# Patient Record
Sex: Female | Born: 1935 | Race: White | Hispanic: No | Smoking: Former smoker
Health system: Southern US, Community
[De-identification: ages and names within clinical notes are randomized; demographics above are authoritative.]

## PROBLEM LIST (undated history)

## (undated) DIAGNOSIS — K219 Gastro-esophageal reflux disease without esophagitis: Secondary | ICD-10-CM

## (undated) DIAGNOSIS — I10 Essential (primary) hypertension: Secondary | ICD-10-CM

## (undated) DIAGNOSIS — J449 Chronic obstructive pulmonary disease, unspecified: Secondary | ICD-10-CM

## (undated) DIAGNOSIS — H409 Unspecified glaucoma: Secondary | ICD-10-CM

## (undated) DIAGNOSIS — E785 Hyperlipidemia, unspecified: Secondary | ICD-10-CM

## (undated) DIAGNOSIS — J301 Allergic rhinitis due to pollen: Secondary | ICD-10-CM

## (undated) DIAGNOSIS — K5792 Diverticulitis of intestine, part unspecified, without perforation or abscess without bleeding: Secondary | ICD-10-CM

## (undated) DIAGNOSIS — M159 Polyosteoarthritis, unspecified: Secondary | ICD-10-CM

## (undated) DIAGNOSIS — I5181 Takotsubo syndrome: Secondary | ICD-10-CM

## (undated) HISTORY — DX: Essential (primary) hypertension: I10

## (undated) HISTORY — DX: Allergic rhinitis due to pollen: J30.1

## (undated) HISTORY — DX: Diverticulitis of intestine, part unspecified, without perforation or abscess without bleeding: K57.92

## (undated) HISTORY — PX: CATARACT EXTRACTION W/ INTRAOCULAR LENS IMPLANT: SHX1309

## (undated) HISTORY — DX: Takotsubo syndrome: I51.81

## (undated) HISTORY — DX: Chronic obstructive pulmonary disease, unspecified: J44.9

## (undated) HISTORY — DX: Unspecified glaucoma: H40.9

## (undated) HISTORY — DX: Hyperlipidemia, unspecified: E78.5

## (undated) HISTORY — DX: Gastro-esophageal reflux disease without esophagitis: K21.9

## (undated) HISTORY — DX: Polyosteoarthritis, unspecified: M15.9

## (undated) HISTORY — PX: TONSILLECTOMY AND ADENOIDECTOMY: SUR1326

---

## 2001-11-23 HISTORY — PX: CHOLECYSTECTOMY: SHX55

## 2007-11-24 DIAGNOSIS — I5181 Takotsubo syndrome: Secondary | ICD-10-CM

## 2007-11-24 HISTORY — DX: Takotsubo syndrome: I51.81

## 2008-11-23 HISTORY — PX: ABDOMINAL HYSTERECTOMY: SHX81

## 2011-06-24 HISTORY — PX: OTHER SURGICAL HISTORY: SHX169

## 2014-10-24 ENCOUNTER — Encounter: Payer: Self-pay | Admitting: Internal Medicine

## 2014-10-24 ENCOUNTER — Ambulatory Visit (INDEPENDENT_AMBULATORY_CARE_PROVIDER_SITE_OTHER): Payer: Medicare Other | Admitting: Internal Medicine

## 2014-10-24 ENCOUNTER — Encounter (INDEPENDENT_AMBULATORY_CARE_PROVIDER_SITE_OTHER): Payer: Self-pay

## 2014-10-24 VITALS — BP 128/70 | HR 110 | Temp 97.7°F | Ht 62.75 in | Wt 126.0 lb

## 2014-10-24 DIAGNOSIS — M150 Primary generalized (osteo)arthritis: Secondary | ICD-10-CM

## 2014-10-24 DIAGNOSIS — J449 Chronic obstructive pulmonary disease, unspecified: Secondary | ICD-10-CM | POA: Insufficient documentation

## 2014-10-24 DIAGNOSIS — I5181 Takotsubo syndrome: Secondary | ICD-10-CM

## 2014-10-24 DIAGNOSIS — K219 Gastro-esophageal reflux disease without esophagitis: Secondary | ICD-10-CM | POA: Insufficient documentation

## 2014-10-24 DIAGNOSIS — M159 Polyosteoarthritis, unspecified: Secondary | ICD-10-CM | POA: Insufficient documentation

## 2014-10-24 DIAGNOSIS — E785 Hyperlipidemia, unspecified: Secondary | ICD-10-CM | POA: Insufficient documentation

## 2014-10-24 DIAGNOSIS — Z23 Encounter for immunization: Secondary | ICD-10-CM

## 2014-10-24 DIAGNOSIS — I10 Essential (primary) hypertension: Secondary | ICD-10-CM

## 2014-10-24 DIAGNOSIS — J301 Allergic rhinitis due to pollen: Secondary | ICD-10-CM | POA: Insufficient documentation

## 2014-10-24 MED ORDER — ALBUTEROL SULFATE (2.5 MG/3ML) 0.083% IN NEBU: 2.5000 mg | INHALATION_SOLUTION | Freq: Four times a day (QID) | RESPIRATORY_TRACT | Status: DC | PRN

## 2014-10-24 NOTE — Assessment & Plan Note (Signed)
Continue secondary prevention

## 2014-10-24 NOTE — Progress Notes (Signed)
Pre visit review using our clinic review tool, if applicable. No additional management support is needed unless otherwise documented below in the visit note. 

## 2014-10-24 NOTE — Progress Notes (Signed)
Subjective:    Patient ID: Jade Villarreal, female    DOB: 09-03-1927, 80 y.o.   MRN: [id number]  HPI  Here to establish for care Here with daughter Johann Capers Just moved here from [place] by herself--living with daughter  Chronic bronchitis-- diagnosed 2009 Did stop smoking in 2005 Recently changed to anoro to take the place of advair and spiriva about a week ago (just with sample) Discussed that this doesn't have inhaled steroid Has intermittent cough--mostly dry May be more emphysema  Also with seasonal allergies  Did get some help with loratadine  Had Takotsubo cardiomyopathy Did have recovery of function Still on ARB, furosemide, statin Was on ASA but stopped--discussed going back on  Heartburn in the past No recent problems after a nexium course Now has nexium for prn use but hasn't needed  No current outpatient prescriptions on file prior to visit.   No current facility-administered medications on file prior to visit.    Allergies  Allergen Reactions  . Lisinopril Swelling    Past Medical History  Diagnosis Date  . Takotsubo cardiomyopathy 2009  . COPD (chronic obstructive pulmonary disease)   . GERD (gastroesophageal reflux disease)   . Diverticulitis   . Allergic rhinitis due to pollen   . Hyperlipidemia   . Hypertension   . Glaucoma     Past Surgical History  Procedure Laterality Date  . Abdominal hysterectomy  2010  . Cataract extraction w/ intraocular lens implant Bilateral   . Tonsillectomy and adenoidectomy    . Cholecystectomy  2003    Family History  Problem Relation Age of Onset  . Stroke Mother   . Heart disease Father   . Stroke Father   . Cancer Brother   . Stroke Brother     History   Social History  . Marital Status: Widowed    Spouse Name: N/A    Number of Children: 4  . Years of Education: N/A   Occupational History  . Oncologist      Retired   Social History Main Topics  . Smoking status:  Former Smoker    Types: Cigarettes    Quit date: 11/24/2003  . Smokeless tobacco: Never Used  . Alcohol Use: No  . Drug Use: No  . Sexual Activity: No   Other Topics Concern  . Not on file   Social History Narrative   3 living children. 1 in [place], [place]-- daughter here      No living will    Requests daughter [name] as health care POA   Not sure about DNR   No tube feeds if cognitively unaware   Review of Systems  Constitutional: Positive for fatigue and unexpected weight change.       Lost 25# in past year--large reason for move here Limited due to resp status--no longer can do housework, shopping, etc Independent with ADLs though   HENT: Positive for hearing loss. Negative for trouble swallowing.        Full dentures  Eyes: Negative for visual disturbance.  Cardiovascular: Positive for palpitations. Negative for chest pain and leg swelling.       Occ racing heart   Gastrointestinal: Negative for nausea, vomiting, abdominal pain and blood in stool.  Genitourinary: Negative for dysuria, hematuria and difficulty urinating.       Some urge incontinence--wears pad or brief  Musculoskeletal: Positive for back pain, joint swelling and arthralgias.       Fairly generalized Knee will swell  at times  Skin: Negative for rash.       Dark spot on side of right eye--plans to establish with derm  Allergic/Immunologic: Positive for environmental allergies. Negative for immunocompromised state.  Neurological: Positive for headaches. Negative for dizziness, syncope and light-headedness.  Hematological: Negative for adenopathy. Bruises/bleeds easily.  Psychiatric/Behavioral: Positive for sleep disturbance. Negative for dysphoric mood. The patient is nervous/anxious.        Not a great sleeper--- melatonin helps "worry wart"---not a big issue       Objective:   Physical Exam  Constitutional: She appears well-developed. No distress.  HENT:  Mouth is dry with some coating on  tongue. No clear thrush  Neck: Normal range of motion. Neck supple. No thyromegaly present.  Cardiovascular: Normal rate, regular rhythm and normal heart sounds.  Exam reveals no gallop.   No murmur heard. Faint pedal pulses  Pulmonary/Chest: No respiratory distress. She has no wheezes. She has no rales.  Pursed lip breathing Decreased breath sounds but clear  Abdominal: Soft. There is no tenderness.  Musculoskeletal: She exhibits no edema or tenderness.  Lymphadenopathy:    She has no cervical adenopathy.  Skin: No rash noted. No erythema.  Benign lesion right cheek  Psychiatric: She has a normal mood and affect. Her behavior is normal.          Assessment & Plan:

## 2014-10-24 NOTE — Assessment & Plan Note (Signed)
BP Readings from Last 3 Encounters:  10/24/14 128/70   Good control Recent blood work--will review when it comes

## 2014-10-24 NOTE — Assessment & Plan Note (Signed)
Does okay with tylenol

## 2014-10-24 NOTE — Assessment & Plan Note (Signed)
Recovered LV function Continue ARB, asa, statin as secondary prevention

## 2014-10-24 NOTE — Addendum Note (Signed)
Addended by: Despina Hidden on: 10/24/2014 03:27 PM   Modules accepted: Orders

## 2014-10-24 NOTE — Assessment & Plan Note (Signed)
Her main issue Has [employer]--will need lighter oxygen portable Will need handicapped tag Stopped all the anticholinergics---use albuterol nebs prn Hold off on the anoro---stay on advair and spiriva for now Consider pulmonary referral--will probably do at next visit  Records requested--will review when they get here

## 2014-10-31 ENCOUNTER — Telehealth: Payer: Self-pay | Admitting: Internal Medicine

## 2014-10-31 NOTE — Telephone Encounter (Signed)
Patient's daughter called and said patient's energy level has gone down every day since she was doing the nebulizer 4 times a day and now she's not doing the nebulizer at all.  Her breathing hasn't worsened, but she has no energy,dizzy, and her hand shakes.  Patient's daughter thinks she needs lab work done.  Dr.Letvak doesn't have any openings for this week and patient's daughter wants to know if she can be seen this week or her mother have lab work done today.

## 2014-10-31 NOTE — Telephone Encounter (Signed)
Spoke with daughter and advised results. She will call if anything changes Also she would like a call back after Dr. Silvio Pate review the records.

## 2014-10-31 NOTE — Telephone Encounter (Signed)
She can add back the ipratropium nebulizers to see if that is why she doesn't feel as good Not sure she needs labs---just got her records today and hope to review them in the next day or so Have her go back to the former nebulizer Rx and see if that helps

## 2014-11-01 ENCOUNTER — Encounter: Payer: Self-pay | Admitting: Internal Medicine

## 2014-11-01 NOTE — Telephone Encounter (Signed)
I reviewed the records and didn't see any recent labs (last I saw was March) No mention of the ipratropium nebs (their notes say combivent inhaler) or the change to [facility]  If she is better back on the regular nebs, no further action. If ongoing problems, should come back in for reeval and I can do labs then

## 2014-11-02 NOTE — Telephone Encounter (Addendum)
Spoke with daughter and advised results. She wanted pt to have labs and "work-up" sooner than march, so I scheduled appt in january

## 2014-11-03 NOTE — Telephone Encounter (Signed)
okay

## 2014-11-13 ENCOUNTER — Telehealth: Payer: Self-pay | Admitting: Internal Medicine

## 2014-11-13 NOTE — Telephone Encounter (Signed)
Form on your desk  

## 2014-11-13 NOTE — Telephone Encounter (Signed)
Pt dropped off disability parking placard form On dee's desk

## 2014-11-14 NOTE — Telephone Encounter (Signed)
I notified Jade Villarreal form is ready to be picked up.

## 2014-11-14 NOTE — Telephone Encounter (Signed)
Form done No charge 

## 2014-11-20 ENCOUNTER — Telehealth: Payer: Self-pay | Admitting: Internal Medicine

## 2014-11-20 NOTE — Telephone Encounter (Signed)
Not that I know of Please see what she needs

## 2014-11-20 NOTE — Telephone Encounter (Signed)
Was pt supposed to get a oxygen tank?

## 2014-11-20 NOTE — Telephone Encounter (Signed)
Spoke with daughter and she states her mother is weak and needs oxygen almost 24 hrs per day, she is trying to take her off of oxygen some times during the day. Daughter is requesting a small portable "purse size" tank for her mother when she takes her out. Per daughter Ace Gins needs a letter from Dr. Silvio Pate stating why she needs a portable tank. Please advise

## 2014-11-20 NOTE — Telephone Encounter (Signed)
Pt's daughter called and says she needs to give you info in ref to an oxygen tank for pt. She says it was supposed to take 3-4 weeks, but nothing has been done.  She requests a c/b ASAP. Thank you.

## 2014-11-21 NOTE — Telephone Encounter (Signed)
Discussed with [facility] and she will handle b/c this is DME.

## 2014-11-21 NOTE — Telephone Encounter (Signed)
Spoke with rep at [facility], [place] and they need a written order for a portable oxygen concentrator, office notes and documentation that she qualifies for oxygen. I explained that the pt came to [place] with oxygen and they will take notes from her previous pulmonary dr.  I did find notes from her previous physician, they are on your desk along with the [place], ok to send? With the written order.

## 2014-11-21 NOTE — Telephone Encounter (Signed)
Please check with [facility] Usually they just need the form to state that she needs portable oxygen. Please prepare whatever they need so she can get the light portable unit

## 2014-11-26 NOTE — Telephone Encounter (Signed)
Order written We cannot resend records from another doctor--they may need to request specific information directly from her past pulmonologist

## 2014-11-27 NOTE — Telephone Encounter (Signed)
Spoke with [facility] and faxed order over

## 2014-11-29 ENCOUNTER — Encounter: Payer: Self-pay | Admitting: Internal Medicine

## 2014-11-29 ENCOUNTER — Ambulatory Visit (INDEPENDENT_AMBULATORY_CARE_PROVIDER_SITE_OTHER): Payer: Medicare Other | Admitting: Internal Medicine

## 2014-11-29 VITALS — BP 148/80 | HR 122 | Temp 97.7°F | Wt 124.0 lb

## 2014-11-29 DIAGNOSIS — K219 Gastro-esophageal reflux disease without esophagitis: Secondary | ICD-10-CM

## 2014-11-29 DIAGNOSIS — E785 Hyperlipidemia, unspecified: Secondary | ICD-10-CM

## 2014-11-29 DIAGNOSIS — I5181 Takotsubo syndrome: Secondary | ICD-10-CM

## 2014-11-29 DIAGNOSIS — J449 Chronic obstructive pulmonary disease, unspecified: Secondary | ICD-10-CM

## 2014-11-29 DIAGNOSIS — I10 Essential (primary) hypertension: Secondary | ICD-10-CM

## 2014-11-29 LAB — CBC WITH DIFFERENTIAL/PLATELET
BASOS ABS: 0.1 10*3/uL (ref 0.0–0.1)
Basophils Relative: 0.5 % (ref 0.0–3.0)
EOS PCT: 1.5 % (ref 0.0–5.0)
Eosinophils Absolute: 0.2 10*3/uL (ref 0.0–0.7)
HEMATOCRIT: 31.3 % — AB (ref 36.0–46.0)
Hemoglobin: 10 g/dL — ABNORMAL LOW (ref 12.0–15.0)
LYMPHS ABS: 1.5 10*3/uL (ref 0.7–4.0)
Lymphocytes Relative: 12.8 % (ref 12.0–46.0)
MCHC: 32 g/dL (ref 30.0–36.0)
MCV: 81.7 fl (ref 78.0–100.0)
MONOS PCT: 9.3 % (ref 3.0–12.0)
Monocytes Absolute: 1.1 10*3/uL — ABNORMAL HIGH (ref 0.1–1.0)
NEUTROS ABS: 8.6 10*3/uL — AB (ref 1.4–7.7)
NEUTROS PCT: 75.9 % (ref 43.0–77.0)
PLATELETS: 511 10*3/uL — AB (ref 150.0–400.0)
RBC: 3.83 Mil/uL — ABNORMAL LOW (ref 3.87–5.11)
RDW: 15.2 % (ref 11.5–15.5)
WBC: 11.4 10*3/uL — AB (ref 4.0–10.5)

## 2014-11-29 LAB — COMPREHENSIVE METABOLIC PANEL
ALT: 13 U/L (ref 0–35)
AST: 15 U/L (ref 0–37)
Albumin: 3 g/dL — ABNORMAL LOW (ref 3.5–5.2)
Alkaline Phosphatase: 95 U/L (ref 39–117)
BUN: 11 mg/dL (ref 6–23)
CO2: 31 meq/L (ref 19–32)
Calcium: 10.4 mg/dL (ref 8.4–10.5)
Chloride: 100 mEq/L (ref 96–112)
Creatinine, Ser: 0.6 mg/dL (ref 0.4–1.2)
GFR: 97.21 mL/min (ref >60.00)
Glucose, Bld: 101 mg/dL — ABNORMAL HIGH (ref 70–99)
Potassium: 3.9 mEq/L (ref 3.5–5.1)
Sodium: 139 mEq/L (ref 135–145)
Total Bilirubin: 0.3 mg/dL (ref 0.2–1.2)
Total Protein: 6.9 g/dL (ref 6.0–8.3)

## 2014-11-29 LAB — LIPID PANEL
CHOL/HDL RATIO: 3
Cholesterol: 137 mg/dL (ref 0–200)
HDL: 39.4 mg/dL (ref >39.00)
LDL Cholesterol: 76 mg/dL (ref 0–99)
NONHDL: 97.6
TRIGLYCERIDES: 110 mg/dL (ref 0.0–149.0)
VLDL: 22 mg/dL (ref 0.0–40.0)

## 2014-11-29 LAB — T4, FREE: FREE T4: 0.9 ng/dL (ref 0.60–1.60)

## 2014-11-29 MED ORDER — PRAVASTATIN SODIUM 20 MG PO TABS: 20.0000 mg | ORAL_TABLET | Freq: Every day | ORAL | Status: DC

## 2014-11-29 MED ORDER — FUROSEMIDE 20 MG PO TABS: 20.0000 mg | ORAL_TABLET | Freq: Every day | ORAL | Status: DC

## 2014-11-29 NOTE — Assessment & Plan Note (Signed)
Doing okay on med Shouldn't decrease due to risk of exacerbating pulm disease

## 2014-11-29 NOTE — Assessment & Plan Note (Signed)
BP Readings from Last 3 Encounters:  11/29/14 148/80  10/24/14 128/70   Acceptable control BP may be higher due to exertion of walking in since she is tachycardic

## 2014-11-29 NOTE — Assessment & Plan Note (Signed)
Severe and very limited ability to walk around Some trouble with my adjustments (cutting out the duoneb and changing to albuterol)---she is back using duoneb up to bid Oxygen just about all the time Awaiting lighter portable oxygen

## 2014-11-29 NOTE — Assessment & Plan Note (Signed)
Due for labs No problems with statin

## 2014-11-29 NOTE — Progress Notes (Signed)
Pre visit review using our clinic review tool, if applicable. No additional management support is needed unless otherwise documented below in the visit note. 

## 2014-11-29 NOTE — Assessment & Plan Note (Signed)
No active symptoms Discussed need to stay as active as possible to prevent aerobic loss of function

## 2014-11-29 NOTE — Progress Notes (Signed)
Subjective:    Patient ID: Jade Villarreal, female    DOB: 1926/11/27, 81 y.o.   MRN: [id number]  HPI Here with daughter for follow up of COPD and other medical conditions  Has been acclimating to [place] waiting from [place] about the lighter oxygen equipment Still taking advair and spiriva as we discussed the last time  Had bad time after last week--- shaking and sleepy. Going to bed right after dinner Daughter close to bringing her to ER --but this did improve Daughter gives protein shakes inbetween meals  More cough in past week or 2 Dry cough Doesn't always cough so this is a change No fever but gets chills in afternoon  Some knee and ankle pain No swelling though Gets soreness around stomach but no chest pain  No heartburn  No swallowing problems  Not walking much Can't support the current portable tank at this point and fatigues easily  Current Outpatient Prescriptions on File Prior to Visit  Medication Sig Dispense Refill  . Acetaminophen (TYLENOL ARTHRITIS EXT RELIEF PO) Take by mouth as needed.    Marland Kitchen albuterol (PROVENTIL) (2.5 MG/3ML) 0.083% nebulizer solution Take 3 mLs (2.5 mg total) by nebulization every 6 (six) hours as needed for wheezing or shortness of breath. 150 mL 1  . aspirin EC 81 MG tablet Take 81 mg by mouth every other day.    . Biotin 1000 MCG tablet Take 1,000 mcg by mouth daily.    . calcium carbonate (OS-CAL) 600 MG TABS tablet Take 600 mg by mouth 2 (two) times daily with a meal.    . Cholecalciferol (VITAMIN D) 2000 UNITS CAPS Take by mouth daily.    . Cyanocobalamin (B-12) 5000 MCG CAPS Take by mouth as needed.    . Fluticasone-Salmeterol (ADVAIR DISKUS) 500-50 MCG/DOSE AEPB Inhale 1 puff into the lungs 2 (two) times daily.    . furosemide (LASIX) 20 MG tablet Take 20 mg by mouth daily.    Marland Kitchen ibuprofen (ADVIL,MOTRIN) 200 MG tablet Take 200 mg by mouth as needed.    Marland Kitchen losartan (COZAAR) 25 MG tablet Take 25 mg by mouth daily.      . Melatonin 5 MG CAPS Take by mouth as needed.    . Misc Natural Products (OSTEO BI-FLEX TRIPLE STRENGTH) TABS Take by mouth daily as needed.    . pravastatin (PRAVACHOL) 20 MG tablet Take 20 mg by mouth daily.    Marland Kitchen Propylene Glycol (SYSTANE BALANCE) 0.6 % SOLN Apply to eye as needed.    . tiotropium (SPIRIVA HANDIHALER) 18 MCG inhalation capsule Place 18 mcg into inhaler and inhale daily.     No current facility-administered medications on file prior to visit.    Allergies  Allergen Reactions  . Lisinopril Swelling    Past Medical History  Diagnosis Date  . Takotsubo cardiomyopathy 2009  . COPD (chronic obstructive pulmonary disease)   . GERD (gastroesophageal reflux disease)   . Diverticulitis   . Allergic rhinitis due to pollen   . Hyperlipidemia   . Hypertension   . Glaucoma   . Osteoarthritis of multiple joints     Past Surgical History  Procedure Laterality Date  . Abdominal hysterectomy  2010  . Cataract extraction w/ intraocular lens implant Bilateral   . Tonsillectomy and adenoidectomy    . Cholecystectomy  2003  . Dexa  8/12    Borderline osteopenia-- T= -1.3 at most    Family History  Problem Relation Age of Onset  . Stroke  Mother   . Heart disease Father   . Stroke Father   . Cancer Brother   . Stroke Brother     History   Social History  . Marital Status: Widowed    Spouse Name: N/A    Number of Children: 4  . Years of Education: N/A   Occupational History  . Oncologist      Retired   Social History Main Topics  . Smoking status: Former Smoker    Types: Cigarettes    Quit date: 11/24/2003  . Smokeless tobacco: Never Used  . Alcohol Use: No  . Drug Use: No  . Sexual Activity: No   Other Topics Concern  . Not on file   Social History Narrative   3 living children. 1 in [place], [place]-- daughter here      No living will    Requests daughter Jade Villarreal as health care POA   Not sure about DNR   No tube feeds if cognitively  unaware   Review of Systems Appetite is off some Weight is down 2# Some constipation--this seems better now Ongoing shaking in hands    Objective:   Physical Exam  Constitutional: No distress.  Very slow walking--daughter supports her  Cardiovascular: Regular rhythm and normal heart sounds.  Exam reveals no gallop.   No murmur heard. Fast after walking in here  Pulmonary/Chest: No respiratory distress. She has no wheezes. She has no rales.  Decreased breath sounds throughout  Not tight or wheezy though Pursed lip breathing still  Musculoskeletal: She exhibits no edema or tenderness.          Assessment & Plan:

## 2014-11-30 ENCOUNTER — Encounter: Payer: Self-pay | Admitting: *Deleted

## 2014-11-30 ENCOUNTER — Telehealth: Payer: Self-pay | Admitting: Internal Medicine

## 2014-11-30 NOTE — Telephone Encounter (Signed)
emmi mailed  °

## 2014-12-03 ENCOUNTER — Other Ambulatory Visit (INDEPENDENT_AMBULATORY_CARE_PROVIDER_SITE_OTHER): Payer: Medicare Other

## 2014-12-03 DIAGNOSIS — D508 Other iron deficiency anemias: Secondary | ICD-10-CM

## 2014-12-03 LAB — FOLATE: Folate: 16.1 ng/mL (ref >5.9)

## 2014-12-03 LAB — VITAMIN B12: VITAMIN B 12: 549 pg/mL (ref 211–911)

## 2014-12-03 LAB — IBC PANEL
Iron: 20 ug/dL — ABNORMAL LOW (ref 42–145)
SATURATION RATIOS: 9.8 % — AB (ref 20.0–50.0)
TRANSFERRIN: 146 mg/dL — AB (ref 212.0–360.0)

## 2014-12-03 LAB — FERRITIN: Ferritin: 319.8 ng/mL — ABNORMAL HIGH (ref 10.0–291.0)

## 2014-12-04 ENCOUNTER — Telehealth: Payer: Self-pay | Admitting: Internal Medicine

## 2014-12-04 ENCOUNTER — Other Ambulatory Visit: Payer: Self-pay | Admitting: Internal Medicine

## 2014-12-04 ENCOUNTER — Ambulatory Visit (INDEPENDENT_AMBULATORY_CARE_PROVIDER_SITE_OTHER)
Admission: RE | Admit: 2014-12-04 | Discharge: 2014-12-04 | Disposition: A | Discharge status: Home / Self Care | Payer: Medicare Other | Source: Ambulatory Visit | Attending: Internal Medicine | Admitting: Internal Medicine

## 2014-12-04 DIAGNOSIS — R05 Cough: Secondary | ICD-10-CM

## 2014-12-04 DIAGNOSIS — R059 Cough, unspecified: Secondary | ICD-10-CM

## 2014-12-04 MED ORDER — LEVOFLOXACIN 500 MG PO TABS: 500.0000 mg | ORAL_TABLET | Freq: Every day | ORAL | Status: DC

## 2014-12-04 NOTE — Telephone Encounter (Signed)
Spoke to patient and then daughter about x-ray Several abnormal areas---some concern for cancer Left side with effusion needs to be treated as pneumonia She did have pneumonia in October which the daughter doesn't think she totally got over  Will treat with levaquin---discussed possibility of confusion with daughter---she will let me know of any problems Will see in 2 weeks--recheck CXR and consider CT scan

## 2014-12-10 ENCOUNTER — Telehealth: Payer: Self-pay | Admitting: Internal Medicine

## 2014-12-10 NOTE — Telephone Encounter (Signed)
Form done No charge 

## 2014-12-10 NOTE — Telephone Encounter (Signed)
Pts daughter [name] dropped off disability placard form to be filled out.Will put inbox Please call when ready to be picked up.

## 2014-12-11 NOTE — Telephone Encounter (Signed)
I notified patient's daughter form is ready to be picked up.

## 2014-12-18 ENCOUNTER — Ambulatory Visit (INDEPENDENT_AMBULATORY_CARE_PROVIDER_SITE_OTHER)
Admission: RE | Admit: 2014-12-18 | Discharge: 2014-12-18 | Disposition: A | Discharge status: Home / Self Care | Payer: Medicare Other | Source: Ambulatory Visit | Attending: Internal Medicine | Admitting: Internal Medicine

## 2014-12-18 ENCOUNTER — Ambulatory Visit (INDEPENDENT_AMBULATORY_CARE_PROVIDER_SITE_OTHER): Payer: Medicare Other | Admitting: Internal Medicine

## 2014-12-18 ENCOUNTER — Encounter: Payer: Self-pay | Admitting: Internal Medicine

## 2014-12-18 VITALS — BP 120/70 | HR 104 | Temp 97.8°F

## 2014-12-18 DIAGNOSIS — J90 Pleural effusion, not elsewhere classified: Secondary | ICD-10-CM | POA: Insufficient documentation

## 2014-12-18 DIAGNOSIS — R059 Cough, unspecified: Secondary | ICD-10-CM

## 2014-12-18 DIAGNOSIS — R05 Cough: Secondary | ICD-10-CM

## 2014-12-18 DIAGNOSIS — J948 Other specified pleural conditions: Secondary | ICD-10-CM

## 2014-12-18 NOTE — Assessment & Plan Note (Signed)
And persistent atelectasis on CXR Very concerning for neoplasm Antibiotic didn't really help the sig dyspnea Will check CT scan (with contrast per radiologist recommendation)

## 2014-12-18 NOTE — Progress Notes (Signed)
Subjective:    Patient ID: Jade Villarreal, female    DOB: 03-11-1927, 81 y.o.   MRN: [id number]  HPI Here with daughter for follow up of dyspnea  Feels perhaps a little better but not much Ongoing DOE---now needs help with instrumental ADLs  Slight cough and white phlegm No fever No chills or sweats recently--but feels cold  Current Outpatient Prescriptions on File Prior to Visit  Medication Sig Dispense Refill  . Acetaminophen (TYLENOL ARTHRITIS EXT RELIEF PO) Take by mouth as needed.    Marland Kitchen albuterol (PROVENTIL) (2.5 MG/3ML) 0.083% nebulizer solution Take 3 mLs (2.5 mg total) by nebulization every 6 (six) hours as needed for wheezing or shortness of breath. 150 mL 1  . aspirin EC 81 MG tablet Take 81 mg by mouth every other day.    . Biotin 1000 MCG tablet Take 1,000 mcg by mouth daily.    . calcium carbonate (OS-CAL) 600 MG TABS tablet Take 600 mg by mouth 2 (two) times daily with a meal.    . Cholecalciferol (VITAMIN D) 2000 UNITS CAPS Take by mouth daily.    . Cyanocobalamin (B-12) 5000 MCG CAPS Take by mouth as needed.    . Fluticasone-Salmeterol (ADVAIR DISKUS) 500-50 MCG/DOSE AEPB Inhale 1 puff into the lungs 2 (two) times daily.    . furosemide (LASIX) 20 MG tablet Take 1 tablet (20 mg total) by mouth daily. 90 tablet 3  . ibuprofen (ADVIL,MOTRIN) 200 MG tablet Take 200 mg by mouth as needed.    Marland Kitchen ipratropium-albuterol (DUONEB) 0.5-2.5 (3) MG/3ML SOLN Take 3 mLs by nebulization 2 (two) times daily as needed.    Marland Kitchen losartan (COZAAR) 25 MG tablet Take 25 mg by mouth daily.    . Melatonin 5 MG CAPS Take by mouth as needed.    . Misc Natural Products (OSTEO BI-FLEX TRIPLE STRENGTH) TABS Take by mouth daily as needed.    . pravastatin (PRAVACHOL) 20 MG tablet Take 1 tablet (20 mg total) by mouth daily. 90 tablet 3  . Propylene Glycol (SYSTANE BALANCE) 0.6 % SOLN Apply to eye as needed.    . tiotropium (SPIRIVA HANDIHALER) 18 MCG inhalation capsule Place 18 mcg into inhaler and  inhale daily.     No current facility-administered medications on file prior to visit.    Allergies  Allergen Reactions  . Lisinopril Swelling    Past Medical History  Diagnosis Date  . Takotsubo cardiomyopathy 2009  . COPD (chronic obstructive pulmonary disease)   . GERD (gastroesophageal reflux disease)   . Diverticulitis   . Allergic rhinitis due to pollen   . Hyperlipidemia   . Hypertension   . Glaucoma   . Osteoarthritis of multiple joints     Past Surgical History  Procedure Laterality Date  . Abdominal hysterectomy  2010  . Cataract extraction w/ intraocular lens implant Bilateral   . Tonsillectomy and adenoidectomy    . Cholecystectomy  2003  . Dexa  8/12    Borderline osteopenia-- T= -1.3 at most    Family History  Problem Relation Age of Onset  . Stroke Mother   . Heart disease Father   . Stroke Father   . Cancer Brother   . Stroke Brother     History   Social History  . Marital Status: Widowed    Spouse Name: N/A    Number of Children: 4  . Years of Education: N/A   Occupational History  . Oncologist      Retired  Social History Main Topics  . Smoking status: Former Smoker    Types: Cigarettes    Quit date: 11/24/2003  . Smokeless tobacco: Never Used  . Alcohol Use: No  . Drug Use: No  . Sexual Activity: No   Other Topics Concern  . Not on file   Social History Narrative   3 living children. 1 in [place], [place]-- daughter here      No living will    Requests daughter [name] as health care POA   Not sure about DNR   No tube feeds if cognitively unaware   Review of Systems Appetite is poor Weight has been going down Sleeps poorly    Objective:   Physical Exam  Constitutional: She appears well-developed. No distress.  HENT:  Left canal blocked with cerumen Right is normal  Neck: No thyromegaly present.  No parotid enlargement (she had felt some swelling recently)  Pulmonary/Chest: Effort normal. No respiratory  distress. She has no wheezes. She has no rales.  Decreased breath sounds in LLL  Lymphadenopathy:    She has no cervical adenopathy.          Assessment & Plan:

## 2014-12-18 NOTE — Progress Notes (Signed)
Pre visit review using our clinic review tool, if applicable. No additional management support is needed unless otherwise documented below in the visit note. 

## 2014-12-19 ENCOUNTER — Ambulatory Visit (INDEPENDENT_AMBULATORY_CARE_PROVIDER_SITE_OTHER)
Admission: RE | Admit: 2014-12-19 | Discharge: 2014-12-19 | Disposition: A | Discharge status: Home / Self Care | Payer: Medicare Other | Source: Ambulatory Visit | Attending: Internal Medicine | Admitting: Internal Medicine

## 2014-12-19 ENCOUNTER — Telehealth: Payer: Self-pay | Admitting: Internal Medicine

## 2014-12-19 DIAGNOSIS — J90 Pleural effusion, not elsewhere classified: Secondary | ICD-10-CM

## 2014-12-19 DIAGNOSIS — J948 Other specified pleural conditions: Secondary | ICD-10-CM

## 2014-12-19 MED ORDER — IOHEXOL 300 MG/ML  SOLN
80.0000 mL | Freq: Once | INTRAMUSCULAR | Status: AC | PRN
  Administered 2014-12-19: 80 mL via INTRAVENOUS

## 2014-12-19 NOTE — Telephone Encounter (Signed)
Call from [facility] radiology with read from CT chest with contrast with obstructing LLL mass 6x6x8 cm also advanced emphysema. Several foci in the liver largest 1x1 cm. Concerning for carcinoma, follow up with PET-CT or tissue sampling recommended.

## 2014-12-20 NOTE — Telephone Encounter (Signed)
PLEASE NOTE: All timestamps contained within this report are represented as [place] Standard Time. CONFIDENTIALTY NOTICE: This fax transmission is intended only for the addressee. It contains information that is legally privileged, confidential or otherwise protected from use or disclosure. If you are not the intended recipient, you are strictly prohibited from reviewing, disclosing, copying using or disseminating any of this information or taking any action in reliance on or regarding this information. If you have received this fax in error, please notify us immediately by telephone so that we can arrange for its return to [place]. Phone: [phone], Toll-Free: [phone], Fax: [phone] Page: 1 of 1 Call Id: [id number] [place] Patient Name: HELMA ARGYLE Gender: Female DOB: Dec 12, 1926 Age: 81 Y 2 M 6 D Return Phone Number: Address: City/State/Zip: [place] Client [place] Night - Client Client Site [place] Physician Viviana Simpler Contact Type Call Call Type Page Only Caller Name [facility] Radiology Relationship To Patient Other Is this call to report lab results? No Return Phone Number Unavailable Initial Comment Caller states they have results to give on the patient. [phone] Nurse Assessment Guidelines Guideline Title Affirmed Question Affirmed Notes Nurse Date/Time (Eastern Time) Disp. Time Eilene Ghazi Time) Disposition Final User 12/19/2014 5:15:16 PM Send to [place], Zachary 12/19/2014 5:26:02 PM Called On-Call Provider May[place], Monus 12/19/2014 5:26:43 PM Page Completed Yes Doug Sou, Monus After Care Instructions Given Call Event Type User Date / Time Description Paging DoctorName DoctorPhone DateTime Result/Outcome Notes Vertell Novak [phone] 12/19/2014 5:25:59 PM Called On Call  Provider - Reached Vertell Novak 12/19/2014 5:26:34 PM Spoke with On Call - General Provided on-call with page information . Connected the oncall to the caller.

## 2014-12-20 NOTE — Telephone Encounter (Signed)
Spoke to daughter at length. Has clear cut metastatic lung cancer. Prognosis is very poor  Might be candidate for palliative RT--though that could be a problem with her severe COPD. Lesion may be easily biopsied with bronchoscopy--but not sure she is a chemo candidate (maybe if small cell which is unlikely with that location). Could consider evaluation with oncologist.  May just want to opt for hospice care   They will come in on Monday at [facility] to discuss this more

## 2014-12-24 ENCOUNTER — Encounter: Payer: Self-pay | Admitting: Internal Medicine

## 2014-12-24 ENCOUNTER — Ambulatory Visit (INDEPENDENT_AMBULATORY_CARE_PROVIDER_SITE_OTHER): Payer: Medicare Other | Admitting: Internal Medicine

## 2014-12-24 VITALS — BP 124/64 | HR 106 | Temp 98.9°F | Resp 18 | Ht 63.0 in | Wt 124.0 lb

## 2014-12-24 DIAGNOSIS — C3492 Malignant neoplasm of unspecified part of left bronchus or lung: Secondary | ICD-10-CM

## 2014-12-24 DIAGNOSIS — C3490 Malignant neoplasm of unspecified part of unspecified bronchus or lung: Secondary | ICD-10-CM | POA: Insufficient documentation

## 2014-12-24 NOTE — Progress Notes (Signed)
Subjective:    Patient ID: Jade Villarreal, female    DOB: [birth year], 81 y.o.   MRN: [id number]  HPI Here with daughter and her husband to review CT scan  Clearly has advanced cancer Obstructing central lesion with spread to liver and adrenals Feels okay right now  Current Outpatient Prescriptions on File Prior to Visit  Medication Sig Dispense Refill  . Acetaminophen (TYLENOL ARTHRITIS EXT RELIEF PO) Take by mouth as needed.    Marland Kitchen albuterol (PROVENTIL) (2.5 MG/3ML) 0.083% nebulizer solution Take 3 mLs (2.5 mg total) by nebulization every 6 (six) hours as needed for wheezing or shortness of breath. 150 mL 1  . aspirin EC 81 MG tablet Take 81 mg by mouth every other day.    . Biotin 1000 MCG tablet Take 1,000 mcg by mouth daily.    . calcium carbonate (OS-CAL) 600 MG TABS tablet Take 600 mg by mouth 2 (two) times daily with a meal.    . Cholecalciferol (VITAMIN D) 2000 UNITS CAPS Take by mouth daily.    . Cyanocobalamin (B-12) 5000 MCG CAPS Take by mouth as needed.    . Fluticasone-Salmeterol (ADVAIR DISKUS) 500-50 MCG/DOSE AEPB Inhale 1 puff into the lungs 2 (two) times daily.    . furosemide (LASIX) 20 MG tablet Take 1 tablet (20 mg total) by mouth daily. 90 tablet 3  . ibuprofen (ADVIL,MOTRIN) 200 MG tablet Take 200 mg by mouth as needed.    Marland Kitchen ipratropium-albuterol (DUONEB) 0.5-2.5 (3) MG/3ML SOLN Take 3 mLs by nebulization 2 (two) times daily as needed.    . Iron TABS Take by mouth daily.    Marland Kitchen losartan (COZAAR) 25 MG tablet Take 25 mg by mouth daily.    . Melatonin 5 MG CAPS Take by mouth as needed.    . Misc Natural Products (OSTEO BI-FLEX TRIPLE STRENGTH) TABS Take by mouth daily as needed.    . pravastatin (PRAVACHOL) 20 MG tablet Take 1 tablet (20 mg total) by mouth daily. 90 tablet 3  . Propylene Glycol (SYSTANE BALANCE) 0.6 % SOLN Apply to eye as needed.    . tiotropium (SPIRIVA HANDIHALER) 18 MCG inhalation capsule Place 18 mcg into inhaler and inhale daily.     No  current facility-administered medications on file prior to visit.    Allergies  Allergen Reactions  . Lisinopril Swelling    Past Medical History  Diagnosis Date  . Takotsubo cardiomyopathy 2009  . COPD (chronic obstructive pulmonary disease)   . GERD (gastroesophageal reflux disease)   . Diverticulitis   . Allergic rhinitis due to pollen   . Hyperlipidemia   . Hypertension   . Glaucoma   . Osteoarthritis of multiple joints     Past Surgical History  Procedure Laterality Date  . Abdominal hysterectomy  2010  . Cataract extraction w/ intraocular lens implant Bilateral   . Tonsillectomy and adenoidectomy    . Cholecystectomy  2003  . Dexa  8/12    Borderline osteopenia-- T= -1.3 at most    Family History  Problem Relation Age of Onset  . Stroke Mother   . Heart disease Father   . Stroke Father   . Cancer Brother   . Stroke Brother     History   Social History  . Marital Status: Widowed    Spouse Name: N/A    Number of Children: 4  . Years of Education: N/A   Occupational History  . Oncologist      Retired  Social History Main Topics  . Smoking status: Former Smoker    Types: Cigarettes    Quit date: 11/24/2003  . Smokeless tobacco: Never Used  . Alcohol Use: No  . Drug Use: No  . Sexual Activity: No   Other Topics Concern  . Not on file   Social History Narrative   3 living children. 1 in [place], [place]-- daughter here      No living will    Requests daughter [name] as health care POA   Not sure about DNR   No tube feeds if cognitively unaware    Review of Systems Appetite not great but daughter making her 3 meals a day and supplements Sleep is not great--- goes to bed early and sleeps for 6 hours or so. Then is up like midnight and can't go back to sleep. Has tried the melatonin    Objective:   Physical Exam  Constitutional: She appears well-developed. No distress.  Psychiatric:  Has insight and seems to understand what is going  on Slight tearful          Assessment & Plan:

## 2014-12-24 NOTE — Progress Notes (Signed)
Pre visit review using our clinic review tool, if applicable. No additional management support is needed unless otherwise documented below in the visit note. 

## 2014-12-24 NOTE — Assessment & Plan Note (Signed)
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

## 2014-12-25 ENCOUNTER — Telehealth: Payer: Self-pay | Admitting: Internal Medicine

## 2014-12-25 NOTE — Telephone Encounter (Signed)
[name] from [facility] stopped by today to try to get Jade Villarreal setup for her portable oxygen concentrator. She needs a POC RX signed and a letter with medical justification for POC Paperwork left on CMA desk. Verbiage examples for letter include "patient requires POC replacing gas tanks due to dexterity problems causing inability to change out tanks" or another example is "patient requires POC replacing gas tanks due to ambulatory issues causing stress on patients back." Any questions or to notify that paperwork is ready please call [facility] @ [phone]

## 2014-12-26 ENCOUNTER — Encounter: Payer: Self-pay | Admitting: Internal Medicine

## 2014-12-26 NOTE — Telephone Encounter (Signed)
Please send letter No charge

## 2014-12-26 NOTE — Telephone Encounter (Signed)
I left a message for Jade Villarreal to let her know letter is ready to be picked up.  I, also, let her know that patient has started Hospice.

## 2015-01-01 ENCOUNTER — Ambulatory Visit: Payer: Medicare Other | Admitting: Internal Medicine

## 2015-01-10 DIAGNOSIS — I5181 Takotsubo syndrome: Secondary | ICD-10-CM | POA: Diagnosis not present

## 2015-01-10 DIAGNOSIS — C3432 Malignant neoplasm of lower lobe, left bronchus or lung: Secondary | ICD-10-CM | POA: Diagnosis not present

## 2015-01-10 DIAGNOSIS — J449 Chronic obstructive pulmonary disease, unspecified: Secondary | ICD-10-CM | POA: Diagnosis not present

## 2015-01-10 DIAGNOSIS — J90 Pleural effusion, not elsewhere classified: Secondary | ICD-10-CM

## 2015-01-18 ENCOUNTER — Ambulatory Visit: Payer: Medicare Other | Admitting: Internal Medicine

## 2015-01-23 ENCOUNTER — Ambulatory Visit: Payer: Medicare Other | Admitting: Internal Medicine

## 2015-02-27 ENCOUNTER — Telehealth: Payer: Self-pay | Admitting: Internal Medicine

## 2015-02-27 MED ORDER — TRAMADOL HCL 50 MG PO TABS: 25.0000 mg | ORAL_TABLET | Freq: Three times a day (TID) | ORAL | Status: AC | PRN

## 2015-02-27 NOTE — Telephone Encounter (Signed)
rx called into pharmacy

## 2015-02-27 NOTE — Telephone Encounter (Signed)
Message from [facility] Having some pain Will start with some tramadol 25-50 tid prn

## 2015-03-04 ENCOUNTER — Telehealth: Payer: Self-pay | Admitting: Internal Medicine

## 2015-03-04 NOTE — Telephone Encounter (Signed)
Notified of ongoing sleep issues on Friday --by Tammy RN with hospice Tried to call today Left message at daughter's house Then spoke to [place] [facility] again Will see if ongoing problems (sleep issues predated the cancer diagnosis) Can start trazodone 50mg  at bedtime if ongoing issue

## 2015-03-08 ENCOUNTER — Other Ambulatory Visit: Payer: Self-pay | Admitting: Internal Medicine

## 2015-03-08 NOTE — Telephone Encounter (Signed)
Connie left v/m requesting refill on albuterol neb sol; left message on home phone per [facility] refill already done.

## 2015-03-21 ENCOUNTER — Ambulatory Visit: Payer: Medicare Other | Admitting: Internal Medicine

## 2015-03-21 ENCOUNTER — Encounter: Payer: Self-pay | Admitting: Internal Medicine

## 2015-03-21 VITALS — BP 126/62 | HR 104 | Resp 32

## 2015-03-21 DIAGNOSIS — M159 Polyosteoarthritis, unspecified: Secondary | ICD-10-CM

## 2015-03-21 DIAGNOSIS — C3492 Malignant neoplasm of unspecified part of left bronchus or lung: Secondary | ICD-10-CM

## 2015-03-21 DIAGNOSIS — M150 Primary generalized (osteo)arthritis: Secondary | ICD-10-CM | POA: Diagnosis not present

## 2015-03-21 DIAGNOSIS — E441 Mild protein-calorie malnutrition: Secondary | ICD-10-CM

## 2015-03-21 DIAGNOSIS — J9611 Chronic respiratory failure with hypoxia: Secondary | ICD-10-CM

## 2015-03-21 NOTE — Progress Notes (Signed)
Subjective:    Patient ID: Jade Villarreal, female    DOB: 1926-12-22, 81 y.o.   MRN: [id number]  HPI Visit for follow up of advanced lung cancer Daughter is here Home visit due to recent significant decline  Now spending most of her time in bed Hospice delivered hospital bed just this morning  Having increasing pain Left side but also on right (chest) No significant expiratory difficulty---lorazepam helps if she has increased work of breathing Usually taking tylenol and tramadol for pain--usually tid with the tramadol  Eating 2 meals per day Has lost weight--down to 116# last week No longer walking so no weight since then  Dizzy at times No syncope No edema  Did have some hallucinations last night--reaching for things, trying to take oxygen off  Current Outpatient Prescriptions on File Prior to Visit  Medication Sig Dispense Refill  . Acetaminophen (TYLENOL ARTHRITIS EXT RELIEF PO) Take by mouth as needed.    Marland Kitchen albuterol (PROVENTIL) (2.5 MG/3ML) 0.083% nebulizer solution INHALE ONE VIAL VIA NEBULIZER EVERY 6 HOURS AS NEEDED FOR WHEEZE OR SHORTNESS OF BREATH 150 mL 1  . aspirin EC 81 MG tablet Take 81 mg by mouth every other day.    . Biotin 1000 MCG tablet Take 1,000 mcg by mouth daily.    . calcium carbonate (OS-CAL) 600 MG TABS tablet Take 600 mg by mouth 2 (two) times daily with a meal.    . Cholecalciferol (VITAMIN D) 2000 UNITS CAPS Take by mouth daily.    . Cyanocobalamin (B-12) 5000 MCG CAPS Take by mouth as needed.    . Fluticasone-Salmeterol (ADVAIR DISKUS) 500-50 MCG/DOSE AEPB Inhale 1 puff into the lungs 2 (two) times daily.    . furosemide (LASIX) 20 MG tablet Take 1 tablet (20 mg total) by mouth daily. 90 tablet 3  . ibuprofen (ADVIL,MOTRIN) 200 MG tablet Take 200 mg by mouth as needed.    Marland Kitchen ipratropium-albuterol (DUONEB) 0.5-2.5 (3) MG/3ML SOLN Take 3 mLs by nebulization 2 (two) times daily as needed.    . Iron TABS Take by mouth daily.    Marland Kitchen losartan  (COZAAR) 25 MG tablet Take 25 mg by mouth daily.    . Melatonin 5 MG CAPS Take by mouth as needed.    . Misc Natural Products (OSTEO BI-FLEX TRIPLE STRENGTH) TABS Take by mouth daily as needed.    . pravastatin (PRAVACHOL) 20 MG tablet Take 1 tablet (20 mg total) by mouth daily. 90 tablet 3  . Propylene Glycol (SYSTANE BALANCE) 0.6 % SOLN Apply to eye as needed.    . tiotropium (SPIRIVA HANDIHALER) 18 MCG inhalation capsule Place 18 mcg into inhaler and inhale daily.    . traMADol (ULTRAM) 50 MG tablet Take 0.5-1 tablets (25-50 mg total) by mouth 3 (three) times daily as needed. 90 tablet 1   No current facility-administered medications on file prior to visit.    Allergies  Allergen Reactions  . Lisinopril Swelling    Past Medical History  Diagnosis Date  . Takotsubo cardiomyopathy 2009  . COPD (chronic obstructive pulmonary disease)   . GERD (gastroesophageal reflux disease)   . Diverticulitis   . Allergic rhinitis due to pollen   . Hyperlipidemia   . Hypertension   . Glaucoma   . Osteoarthritis of multiple joints     Past Surgical History  Procedure Laterality Date  . Abdominal hysterectomy  2010  . Cataract extraction w/ intraocular lens implant Bilateral   . Tonsillectomy and adenoidectomy    .  Cholecystectomy  2003  . Dexa  8/12    Borderline osteopenia-- T= -1.3 at most    Family History  Problem Relation Age of Onset  . Stroke Mother   . Heart disease Father   . Stroke Father   . Cancer Brother   . Stroke Brother     History   Social History  . Marital Status: Widowed    Spouse Name: N/A  . Number of Children: 4  . Years of Education: N/A   Occupational History  . Oncologist      Retired   Social History Main Topics  . Smoking status: Former Smoker    Types: Cigarettes    Quit date: 11/24/2003  . Smokeless tobacco: Never Used  . Alcohol Use: No  . Drug Use: No  . Sexual Activity: No   Other Topics Concern  . Not on file   Social  History Narrative   3 living children. 1 in [place], [place]-- daughter here      No living will    Requests daughter Jade Villarreal as health care POA   Not sure about DNR   No tube feeds if cognitively unaware   Review of Systems Bowels were slow-- went okay after MOM No skin breakdown or ulcers. Bruising on left side after rolled out of bed a few days ago--arm and hip No urinary problems Generally sleeping okay    Objective:   Physical Exam  Constitutional:  Clear wasting Mild to moderate resp distress with tachypnea (related to prolonged sitting up this morning while they were setting up her hospital bed)  Neck: No thyromegaly present.  Cardiovascular: Normal rate, regular rhythm, normal heart sounds and intact distal pulses.  Exam reveals no gallop.   No murmur heard. Pulmonary/Chest: She has no wheezes. She has no rales.  Absent breath sounds entire left base  Abdominal: Soft. There is no tenderness.  Musculoskeletal: She exhibits no edema or tenderness.  Lymphadenopathy:    She has no cervical adenopathy.  Neurological:  Somnolent but appropriate answers to questions  Skin:  No skin breakdown          Assessment & Plan:

## 2015-03-21 NOTE — Assessment & Plan Note (Addendum)
Clearly progressing quickly now More pleural effusion Marked decline in functional status Dicussed with daughter that things may progress quickly--that she might not even last a week Counseled daughter on getting help--especially if she is up all night with her mom (like last night) I doubt she will need Hospice Home but discussed when that could be appropriate

## 2015-03-21 NOTE — Assessment & Plan Note (Signed)
Fortunately she doesn't seem to be in much pain Tylenol and tramadol are adequate I believe her decline will be too precipitous to titrate a fentanyl patch--so would plan to just use the roxanol for now and assess what her needs will be

## 2015-03-21 NOTE — Assessment & Plan Note (Signed)
Despite oxygen, she is very tachypneic and in distress after up for a few hours Discussed starting roxanol prn for increased respiratory rate and dyspnea

## 2015-03-21 NOTE — Assessment & Plan Note (Signed)
She has lost considerable weight despite eating okay I doubt we can make much inroads--daughter will continue to encourage her

## 2015-04-01 ENCOUNTER — Ambulatory Visit: Payer: Medicare Other | Admitting: Internal Medicine

## 2015-04-03 ENCOUNTER — Telehealth: Payer: Self-pay | Admitting: Internal Medicine

## 2015-04-03 NOTE — Telephone Encounter (Signed)
Patient's daughter called to let you know patient passed away on 02-28-23.

## 2015-04-03 NOTE — Telephone Encounter (Signed)
Finally did get her to pass on condolences They brought her back up to [place] for burial

## 2015-04-24 DEATH — deceased

## 2016-05-14 IMAGING — CR DG CHEST 2V
2 series · 2 of 2 positions shown · non-contrast
Comparison: None.

CLINICAL DATA: Nonproductive cough for 1 month.

EXAM:
CHEST  2 VIEW

[view not recorded (1 of 2)]
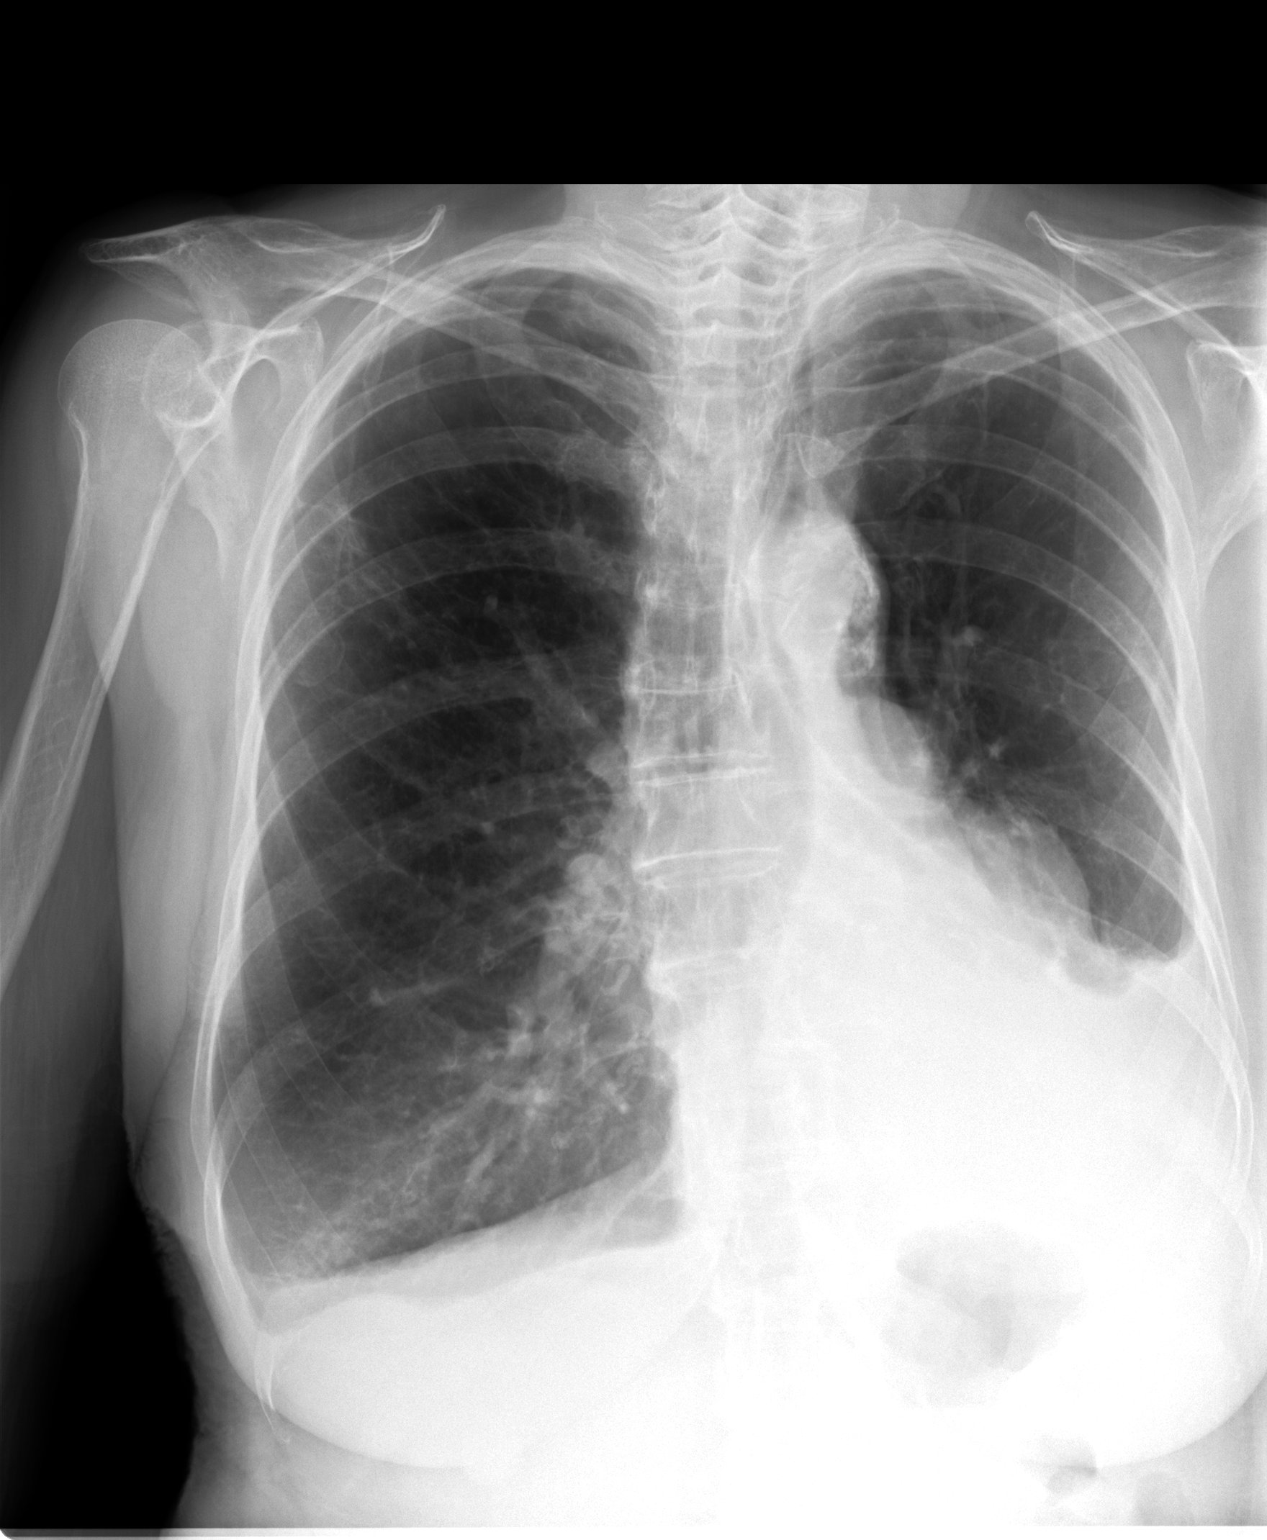

[view not recorded (2 of 2)]
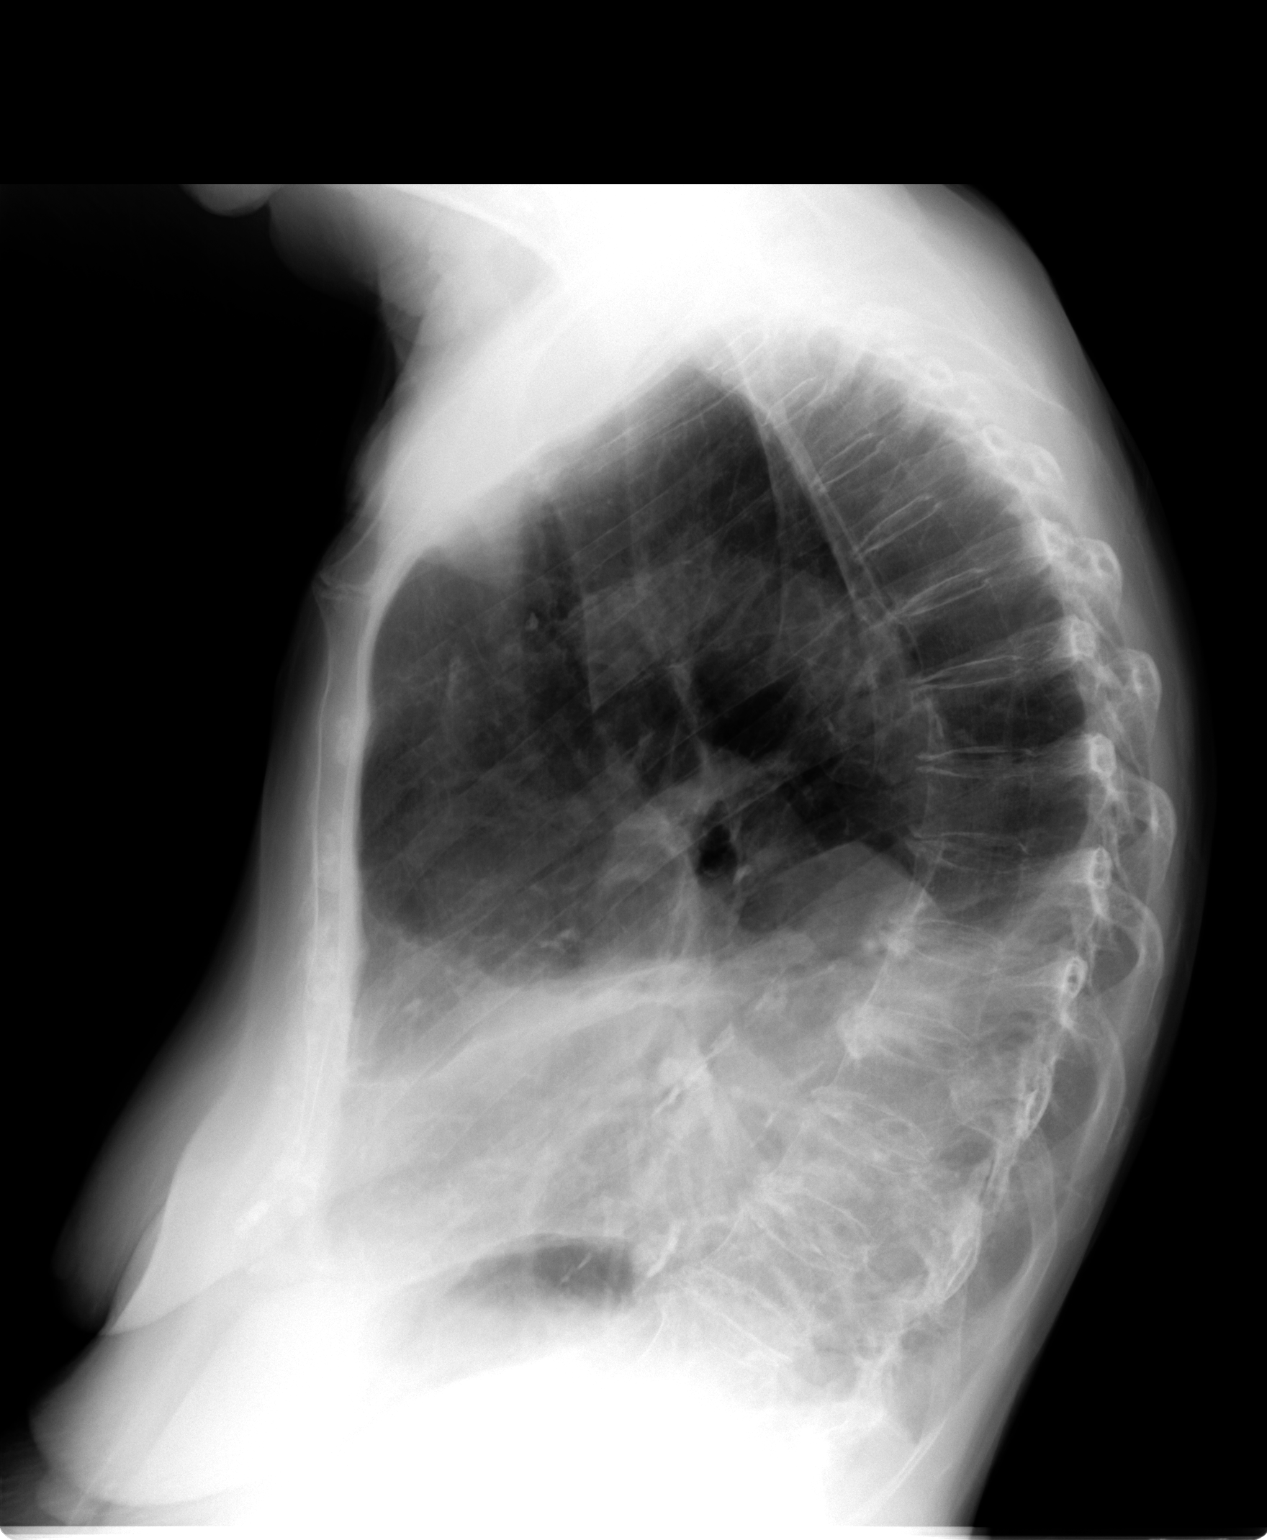

[2 of 2 positions shown; findings below may reference images not displayed]

FINDINGS: Mediastinum and hilar structures are normal. Left lower lobe dense
infiltrate with atelectasis and prominent left-sided pleural
effusion noted. Small ill-defined infiltrates right upper lobe and
right lower lobe. Close follow-up chest x-rays recommended to
demonstrate complete clearing of all of these infiltrates. Mild
cardiomegaly. No pulmonary venous congestion. Diffuse osteopenia and
degenerative change of the thoracic spine with multiple mild
compression fractures, age undetermined.
IMPRESSION: 1. Dense left lower lobe infiltrate and atelectasis with moderate
left-sided pleural effusion.
2. Ill-defined densities in the right upper lobe and right lower
lobe. These could represent mild infiltrates. Close follow-up chest
x-rays are recommended demonstrate complete clearing of all of these
findings, particularly to ensure the absence of an underlying mass
lesion.
3. Diffuse thoracic spine osteopenia and degenerative change. Mild
thoracic spine compression fractures, age undetermined.
